# Patient Record
Sex: Male | Born: 1990 | Race: Black or African American | Hispanic: No | Marital: Single | Smoking: Never smoker
Health system: Southern US, Community
[De-identification: ages and names within clinical notes are randomized; demographics above are authoritative.]

## PROBLEM LIST (undated history)

## (undated) ENCOUNTER — Emergency Department (HOSPITAL_COMMUNITY): Admission: EM | Payer: BLUE CROSS/BLUE SHIELD

## (undated) DIAGNOSIS — M5431 Sciatica, right side: Secondary | ICD-10-CM

## (undated) HISTORY — DX: Sciatica, right side: M54.31

## (undated) HISTORY — PX: WISDOM TOOTH EXTRACTION: SHX21

---

## 2015-09-14 HISTORY — PX: HAND SURGERY: SHX662

## 2016-01-08 DIAGNOSIS — F902 Attention-deficit hyperactivity disorder, combined type: Secondary | ICD-10-CM | POA: Diagnosis not present

## 2016-04-01 DIAGNOSIS — F902 Attention-deficit hyperactivity disorder, combined type: Secondary | ICD-10-CM | POA: Diagnosis not present

## 2016-07-15 ENCOUNTER — Emergency Department (HOSPITAL_COMMUNITY)
Admission: EM | Admit: 2016-07-15 | Discharge: 2016-07-15 | Disposition: A | Discharge status: Home / Self Care | Payer: BLUE CROSS/BLUE SHIELD | Attending: Emergency Medicine | Admitting: Emergency Medicine

## 2016-07-15 ENCOUNTER — Encounter (HOSPITAL_COMMUNITY): Payer: Self-pay | Admitting: Emergency Medicine

## 2016-07-15 ENCOUNTER — Emergency Department (HOSPITAL_COMMUNITY): Payer: BLUE CROSS/BLUE SHIELD

## 2016-07-15 DIAGNOSIS — Y92009 Unspecified place in unspecified non-institutional (private) residence as the place of occurrence of the external cause: Secondary | ICD-10-CM | POA: Diagnosis not present

## 2016-07-15 DIAGNOSIS — Y939 Activity, unspecified: Secondary | ICD-10-CM | POA: Diagnosis not present

## 2016-07-15 DIAGNOSIS — S62346A Nondisplaced fracture of base of fifth metacarpal bone, right hand, initial encounter for closed fracture: Secondary | ICD-10-CM | POA: Insufficient documentation

## 2016-07-15 DIAGNOSIS — W2201XA Walked into wall, initial encounter: Secondary | ICD-10-CM | POA: Insufficient documentation

## 2016-07-15 DIAGNOSIS — F17200 Nicotine dependence, unspecified, uncomplicated: Secondary | ICD-10-CM | POA: Insufficient documentation

## 2016-07-15 DIAGNOSIS — S62366A Nondisplaced fracture of neck of fifth metacarpal bone, right hand, initial encounter for closed fracture: Secondary | ICD-10-CM | POA: Diagnosis not present

## 2016-07-15 DIAGNOSIS — S62396A Other fracture of fifth metacarpal bone, right hand, initial encounter for closed fracture: Secondary | ICD-10-CM | POA: Diagnosis not present

## 2016-07-15 DIAGNOSIS — S62306A Unspecified fracture of fifth metacarpal bone, right hand, initial encounter for closed fracture: Secondary | ICD-10-CM

## 2016-07-15 DIAGNOSIS — Y999 Unspecified external cause status: Secondary | ICD-10-CM | POA: Insufficient documentation

## 2016-07-15 DIAGNOSIS — S6991XA Unspecified injury of right wrist, hand and finger(s), initial encounter: Secondary | ICD-10-CM | POA: Diagnosis not present

## 2016-07-15 MED ORDER — HYDROCODONE-ACETAMINOPHEN 5-325 MG PO TABS: ORAL_TABLET | ORAL | 0 refills | Status: DC
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
## 2016-07-15 NOTE — Discharge Instructions (Addendum)
Take vicodin for breakthrough pain, do not drink alcohol, drive, care for children or do other critical tasks while taking vicodin. ° °Please follow with your primary care doctor in the next 2 days for a check-up. They must obtain records for further management.  ° °Do not hesitate to return to the Emergency Department for any new, worsening or concerning symptoms.  ° °
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
## 2016-07-21 DIAGNOSIS — M79641 Pain in right hand: Secondary | ICD-10-CM | POA: Diagnosis not present

## 2016-07-22 DIAGNOSIS — Y999 Unspecified external cause status: Secondary | ICD-10-CM | POA: Diagnosis not present

## 2016-07-22 DIAGNOSIS — S62316A Displaced fracture of base of fifth metacarpal bone, right hand, initial encounter for closed fracture: Secondary | ICD-10-CM | POA: Diagnosis not present

## 2016-07-22 DIAGNOSIS — S62301A Unspecified fracture of second metacarpal bone, left hand, initial encounter for closed fracture: Secondary | ICD-10-CM | POA: Diagnosis not present

## 2016-07-28 DIAGNOSIS — S62301D Unspecified fracture of second metacarpal bone, left hand, subsequent encounter for fracture with routine healing: Secondary | ICD-10-CM | POA: Diagnosis not present

## 2016-08-09 DIAGNOSIS — F902 Attention-deficit hyperactivity disorder, combined type: Secondary | ICD-10-CM | POA: Diagnosis not present

## 2016-08-11 DIAGNOSIS — S62301D Unspecified fracture of second metacarpal bone, left hand, subsequent encounter for fracture with routine healing: Secondary | ICD-10-CM | POA: Diagnosis not present

## 2016-08-25 DIAGNOSIS — S62301D Unspecified fracture of second metacarpal bone, left hand, subsequent encounter for fracture with routine healing: Secondary | ICD-10-CM | POA: Diagnosis not present

## 2016-09-22 DIAGNOSIS — M79641 Pain in right hand: Secondary | ICD-10-CM | POA: Diagnosis not present

## 2016-11-29 DIAGNOSIS — F902 Attention-deficit hyperactivity disorder, combined type: Secondary | ICD-10-CM | POA: Diagnosis not present

## 2017-01-11 DIAGNOSIS — R369 Urethral discharge, unspecified: Secondary | ICD-10-CM | POA: Diagnosis not present

## 2017-01-11 DIAGNOSIS — N342 Other urethritis: Secondary | ICD-10-CM | POA: Diagnosis not present

## 2017-01-11 DIAGNOSIS — N390 Urinary tract infection, site not specified: Secondary | ICD-10-CM | POA: Diagnosis not present

## 2017-01-11 DIAGNOSIS — Z0000 Encounter for general adult medical examination without abnormal findings: Secondary | ICD-10-CM | POA: Diagnosis not present

## 2017-01-11 DIAGNOSIS — Z114 Encounter for screening for human immunodeficiency virus [HIV]: Secondary | ICD-10-CM | POA: Diagnosis not present

## 2017-01-23 ENCOUNTER — Emergency Department (HOSPITAL_COMMUNITY)
Admission: EM | Admit: 2017-01-23 | Discharge: 2017-01-23 | Disposition: A | Discharge status: Home / Self Care | Payer: BLUE CROSS/BLUE SHIELD | Source: Home / Self Care | Attending: Emergency Medicine | Admitting: Emergency Medicine

## 2017-01-23 ENCOUNTER — Encounter (HOSPITAL_COMMUNITY): Payer: Self-pay

## 2017-01-23 DIAGNOSIS — Y92410 Unspecified street and highway as the place of occurrence of the external cause: Secondary | ICD-10-CM | POA: Insufficient documentation

## 2017-01-23 DIAGNOSIS — Z23 Encounter for immunization: Secondary | ICD-10-CM | POA: Insufficient documentation

## 2017-01-23 DIAGNOSIS — Z87891 Personal history of nicotine dependence: Secondary | ICD-10-CM | POA: Diagnosis not present

## 2017-01-23 DIAGNOSIS — R591 Generalized enlarged lymph nodes: Secondary | ICD-10-CM | POA: Diagnosis not present

## 2017-01-23 DIAGNOSIS — Y939 Activity, unspecified: Secondary | ICD-10-CM

## 2017-01-23 DIAGNOSIS — Z79899 Other long term (current) drug therapy: Secondary | ICD-10-CM | POA: Insufficient documentation

## 2017-01-23 DIAGNOSIS — S3992XA Unspecified injury of lower back, initial encounter: Secondary | ICD-10-CM | POA: Diagnosis not present

## 2017-01-23 DIAGNOSIS — S60221A Contusion of right hand, initial encounter: Secondary | ICD-10-CM | POA: Diagnosis not present

## 2017-01-23 DIAGNOSIS — R599 Enlarged lymph nodes, unspecified: Secondary | ICD-10-CM | POA: Diagnosis not present

## 2017-01-23 DIAGNOSIS — S39012A Strain of muscle, fascia and tendon of lower back, initial encounter: Secondary | ICD-10-CM | POA: Diagnosis not present

## 2017-01-23 DIAGNOSIS — K029 Dental caries, unspecified: Secondary | ICD-10-CM | POA: Diagnosis not present

## 2017-01-23 DIAGNOSIS — T148XXA Other injury of unspecified body region, initial encounter: Secondary | ICD-10-CM

## 2017-01-23 DIAGNOSIS — Y999 Unspecified external cause status: Secondary | ICD-10-CM | POA: Insufficient documentation

## 2017-01-23 DIAGNOSIS — S50811A Abrasion of right forearm, initial encounter: Secondary | ICD-10-CM | POA: Diagnosis not present

## 2017-01-23 DIAGNOSIS — M79641 Pain in right hand: Secondary | ICD-10-CM | POA: Insufficient documentation

## 2017-01-23 DIAGNOSIS — T07XXXA Unspecified multiple injuries, initial encounter: Secondary | ICD-10-CM
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
## 2017-01-23 NOTE — ED Notes (Signed)
Pt reported at time of DC he was angry he did not get pain meds.

## 2017-01-24 ENCOUNTER — Emergency Department (HOSPITAL_COMMUNITY): Payer: BLUE CROSS/BLUE SHIELD

## 2017-01-24 ENCOUNTER — Encounter (HOSPITAL_COMMUNITY): Payer: Self-pay | Admitting: Emergency Medicine

## 2017-01-24 ENCOUNTER — Emergency Department (HOSPITAL_COMMUNITY)
Admission: EM | Admit: 2017-01-24 | Discharge: 2017-01-24 | Disposition: A | Discharge status: Home / Self Care | Payer: BLUE CROSS/BLUE SHIELD | Attending: Emergency Medicine | Admitting: Emergency Medicine

## 2017-01-24 DIAGNOSIS — S39012A Strain of muscle, fascia and tendon of lower back, initial encounter: Secondary | ICD-10-CM

## 2017-01-24 DIAGNOSIS — M79641 Pain in right hand: Secondary | ICD-10-CM

## 2017-01-24 DIAGNOSIS — K029 Dental caries, unspecified: Secondary | ICD-10-CM

## 2017-01-24 DIAGNOSIS — R591 Generalized enlarged lymph nodes: Secondary | ICD-10-CM

## 2017-01-24 DIAGNOSIS — T148XXA Other injury of unspecified body region, initial encounter: Secondary | ICD-10-CM

## 2017-01-24 MED ORDER — TETANUS-DIPHTH-ACELL PERTUSSIS 5-2.5-18.5 LF-MCG/0.5 IM SUSP
0.5000 mL | Freq: Once | INTRAMUSCULAR | Status: AC
  Administered 2017-01-24: 0.5 mL via INTRAMUSCULAR
  Filled 2017-01-24: qty 0.5

## 2017-01-24 MED ORDER — DICLOFENAC SODIUM 1 % TD GEL: 2.0000 g | Freq: Four times a day (QID) | TRANSDERMAL | 0 refills | Status: DC

## 2017-01-24 MED ORDER — METHOCARBAMOL 500 MG PO TABS: 500.0000 mg | ORAL_TABLET | Freq: Two times a day (BID) | ORAL | 0 refills | Status: DC

## 2017-01-24 MED ORDER — METHOCARBAMOL 500 MG PO TABS
500.0000 mg | ORAL_TABLET | Freq: Once | ORAL | Status: AC
  Administered 2017-01-24: 500 mg via ORAL
  Filled 2017-01-24: qty 1

## 2017-01-24 MED ORDER — PENICILLIN V POTASSIUM 500 MG PO TABS: 1000.0000 mg | ORAL_TABLET | Freq: Two times a day (BID) | ORAL | 0 refills | Status: DC

## 2017-01-24 MED ORDER — NAPROXEN 500 MG PO TABS: 500.0000 mg | ORAL_TABLET | Freq: Two times a day (BID) | ORAL | 0 refills | Status: DC

## 2017-01-24 MED ORDER — KETOROLAC TROMETHAMINE 60 MG/2ML IM SOLN
60.0000 mg | Freq: Once | INTRAMUSCULAR | Status: AC
  Administered 2017-01-24: 60 mg via INTRAMUSCULAR
  Filled 2017-01-24: qty 2
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
## 2017-01-24 NOTE — Discharge Instructions (Signed)
1. Medications: robaxin, naproxyn, Voltaren gel, penicillin, usual home medications 2. Treatment: rest, drink plenty of fluids, gentle stretching as discussed, alternate ice and heat 3. Follow Up: Please followup with your primary doctor in 3 days for discussion of your diagnoses and further evaluation after today's visit; if you do not have a primary care doctor use the resource guide provided to find one;  Return to the ER for worsening back pain, difficulty walking, loss of bowel or bladder control or other concerning symptoms
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
## 2017-01-24 NOTE — ED Notes (Signed)
Pt in xray

## 2017-02-10 DIAGNOSIS — M549 Dorsalgia, unspecified: Secondary | ICD-10-CM | POA: Diagnosis not present

## 2017-02-15 ENCOUNTER — Ambulatory Visit (HOSPITAL_COMMUNITY)
Admission: RE | Admit: 2017-02-15 | Discharge: 2017-02-15 | Disposition: A | Discharge status: Home / Self Care | Payer: BLUE CROSS/BLUE SHIELD | Source: Ambulatory Visit | Attending: Internal Medicine | Admitting: Internal Medicine

## 2017-02-15 ENCOUNTER — Other Ambulatory Visit: Payer: Self-pay | Admitting: Internal Medicine

## 2017-02-15 DIAGNOSIS — M542 Cervicalgia: Secondary | ICD-10-CM | POA: Diagnosis not present

## 2017-02-15 DIAGNOSIS — M546 Pain in thoracic spine: Secondary | ICD-10-CM | POA: Diagnosis not present

## 2017-02-15 DIAGNOSIS — R52 Pain, unspecified: Secondary | ICD-10-CM | POA: Diagnosis not present

## 2017-02-15 DIAGNOSIS — M545 Low back pain: Secondary | ICD-10-CM | POA: Diagnosis not present

## 2017-02-15 DIAGNOSIS — S3992XA Unspecified injury of lower back, initial encounter: Secondary | ICD-10-CM | POA: Diagnosis not present

## 2017-02-15 DIAGNOSIS — S199XXA Unspecified injury of neck, initial encounter: Secondary | ICD-10-CM | POA: Diagnosis not present

## 2017-02-21 DIAGNOSIS — F902 Attention-deficit hyperactivity disorder, combined type: Secondary | ICD-10-CM | POA: Diagnosis not present

## 2017-02-21 DIAGNOSIS — S39012A Strain of muscle, fascia and tendon of lower back, initial encounter: Secondary | ICD-10-CM | POA: Diagnosis not present
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
## 2019-06-16 ENCOUNTER — Emergency Department (HOSPITAL_COMMUNITY)
Admission: EM | Admit: 2019-06-16 | Discharge: 2019-06-16 | Disposition: A | Discharge status: Home / Self Care | Payer: BLUE CROSS/BLUE SHIELD | Attending: Emergency Medicine | Admitting: Emergency Medicine

## 2019-06-16 ENCOUNTER — Other Ambulatory Visit: Payer: Self-pay

## 2019-06-16 ENCOUNTER — Encounter (HOSPITAL_COMMUNITY): Payer: Self-pay | Admitting: *Deleted

## 2019-06-16 DIAGNOSIS — Z5321 Procedure and treatment not carried out due to patient leaving prior to being seen by health care provider: Secondary | ICD-10-CM | POA: Insufficient documentation

## 2019-06-16 DIAGNOSIS — R1013 Epigastric pain: Secondary | ICD-10-CM | POA: Diagnosis present

## 2019-06-16 LAB — COMPREHENSIVE METABOLIC PANEL
ALT: 21 U/L (ref 0–44)
AST: 22 U/L (ref 15–41)
Albumin: 4.4 g/dL (ref 3.5–5.0)
Alkaline Phosphatase: 48 U/L (ref 38–126)
Anion gap: 10 (ref 5–15)
BUN: 17 mg/dL (ref 6–20)
CO2: 20 mmol/L — ABNORMAL LOW (ref 22–32)
Calcium: 9.3 mg/dL (ref 8.9–10.3)
Chloride: 108 mmol/L (ref 98–111)
Creatinine, Ser: 1.3 mg/dL — ABNORMAL HIGH (ref 0.61–1.24)
GFR calc Af Amer: >60 mL/min (ref >60)
GFR calc non Af Amer: >60 mL/min (ref >60)
Glucose, Bld: 111 mg/dL — ABNORMAL HIGH (ref 70–99)
Potassium: 3.7 mmol/L (ref 3.5–5.1)
Sodium: 138 mmol/L (ref 135–145)
Total Bilirubin: 0.8 mg/dL (ref 0.3–1.2)
Total Protein: 7.7 g/dL (ref 6.5–8.1)

## 2019-06-16 LAB — CBC
HCT: 44.1 % (ref 39.0–52.0)
Hemoglobin: 14.2 g/dL (ref 13.0–17.0)
MCH: 27.5 pg (ref 26.0–34.0)
MCHC: 32.2 g/dL (ref 30.0–36.0)
MCV: 85.3 fL (ref 80.0–100.0)
Platelets: 235 10*3/uL (ref 150–400)
RBC: 5.17 MIL/uL (ref 4.22–5.81)
RDW: 14.1 % (ref 11.5–15.5)
WBC: 9.8 10*3/uL (ref 4.0–10.5)
nRBC: 0 % (ref 0.0–0.2)

## 2019-06-16 LAB — LIPASE, BLOOD: Lipase: 21 U/L (ref 11–51)

## 2019-06-16 MED ORDER — SODIUM CHLORIDE 0.9% FLUSH: 3.0000 mL | Freq: Once | INTRAVENOUS | Status: DC

## 2019-06-16 NOTE — ED Notes (Signed)
Called and pt was not present in lobby.

## 2019-06-16 NOTE — ED Triage Notes (Signed)
Pt ate food on Wed and started having abd pain with diarrhea yesterday.

## 2020-05-20 ENCOUNTER — Other Ambulatory Visit: Payer: Self-pay | Admitting: Otolaryngology

## 2020-05-20 DIAGNOSIS — S022XXD Fracture of nasal bones, subsequent encounter for fracture with routine healing: Secondary | ICD-10-CM | POA: Insufficient documentation

## 2020-05-21 ENCOUNTER — Other Ambulatory Visit: Payer: Self-pay

## 2020-05-21 ENCOUNTER — Encounter (HOSPITAL_BASED_OUTPATIENT_CLINIC_OR_DEPARTMENT_OTHER): Payer: Self-pay | Admitting: Otolaryngology

## 2020-05-21 ENCOUNTER — Other Ambulatory Visit (HOSPITAL_COMMUNITY)
Admission: RE | Admit: 2020-05-21 | Discharge: 2020-05-21 | Disposition: A | Discharge status: Home / Self Care | Payer: Self-pay | Source: Ambulatory Visit | Attending: Otolaryngology | Admitting: Otolaryngology

## 2020-05-21 DIAGNOSIS — Z01812 Encounter for preprocedural laboratory examination: Secondary | ICD-10-CM | POA: Insufficient documentation

## 2020-05-21 DIAGNOSIS — Z20822 Contact with and (suspected) exposure to covid-19: Secondary | ICD-10-CM | POA: Insufficient documentation

## 2020-05-21 LAB — SARS CORONAVIRUS 2 (TAT 6-24 HRS): SARS Coronavirus 2: NEGATIVE

## 2020-05-23 ENCOUNTER — Ambulatory Visit (HOSPITAL_BASED_OUTPATIENT_CLINIC_OR_DEPARTMENT_OTHER)
Admission: RE | Admit: 2020-05-23 | Discharge: 2020-05-23 | Disposition: A | Discharge status: Home / Self Care | Payer: Self-pay | Attending: Otolaryngology | Admitting: Otolaryngology

## 2020-05-23 ENCOUNTER — Other Ambulatory Visit: Payer: Self-pay

## 2020-05-23 ENCOUNTER — Encounter (HOSPITAL_BASED_OUTPATIENT_CLINIC_OR_DEPARTMENT_OTHER)
Admission: RE | Disposition: A | Discharge status: Home / Self Care | Payer: Self-pay | Source: Home / Self Care | Attending: Otolaryngology

## 2020-05-23 ENCOUNTER — Encounter (HOSPITAL_BASED_OUTPATIENT_CLINIC_OR_DEPARTMENT_OTHER): Payer: Self-pay | Admitting: Otolaryngology

## 2020-05-23 ENCOUNTER — Ambulatory Visit (HOSPITAL_BASED_OUTPATIENT_CLINIC_OR_DEPARTMENT_OTHER): Payer: Self-pay | Admitting: Certified Registered"

## 2020-05-23 DIAGNOSIS — W01198A Fall on same level from slipping, tripping and stumbling with subsequent striking against other object, initial encounter: Secondary | ICD-10-CM | POA: Insufficient documentation

## 2020-05-23 DIAGNOSIS — Z87891 Personal history of nicotine dependence: Secondary | ICD-10-CM | POA: Insufficient documentation

## 2020-05-23 DIAGNOSIS — S022XXA Fracture of nasal bones, initial encounter for closed fracture: Secondary | ICD-10-CM | POA: Insufficient documentation

## 2020-05-23 HISTORY — PX: CLOSED REDUCTION NASAL FRACTURE: SHX5365

## 2020-05-23 SURGERY — CLOSED REDUCTION, FRACTURE, NASAL BONE
Anesthesia: General | Site: Nose

## 2020-05-23 MED ORDER — FENTANYL CITRATE (PF) 100 MCG/2ML IJ SOLN
INTRAMUSCULAR | Status: DC | PRN
Start: 2020-05-23 — End: 2020-05-23
  Administered 2020-05-23: 50 ug via INTRAVENOUS
  Administered 2020-05-23: 25 ug via INTRAVENOUS

## 2020-05-23 MED ORDER — LIDOCAINE 2% (20 MG/ML) 5 ML SYRINGE
INTRAMUSCULAR | Status: DC | PRN
  Administered 2020-05-23: 60 mg via INTRAVENOUS

## 2020-05-23 MED ORDER — LACTATED RINGERS IV SOLN: INTRAVENOUS | Status: DC

## 2020-05-23 MED ORDER — DEXAMETHASONE SODIUM PHOSPHATE 10 MG/ML IJ SOLN
INTRAMUSCULAR | Status: AC
  Filled 2020-05-23: qty 1

## 2020-05-23 MED ORDER — DEXMEDETOMIDINE (PRECEDEX) IN NS 20 MCG/5ML (4 MCG/ML) IV SYRINGE
PREFILLED_SYRINGE | INTRAVENOUS | Status: DC | PRN
  Administered 2020-05-23: 4 ug via INTRAVENOUS
  Administered 2020-05-23: 8 ug via INTRAVENOUS
  Administered 2020-05-23: 4 ug via INTRAVENOUS

## 2020-05-23 MED ORDER — FENTANYL CITRATE (PF) 100 MCG/2ML IJ SOLN: 25.0000 ug | INTRAMUSCULAR | Status: DC | PRN

## 2020-05-23 MED ORDER — DEXAMETHASONE SODIUM PHOSPHATE 10 MG/ML IJ SOLN
INTRAMUSCULAR | Status: DC | PRN
  Administered 2020-05-23: 5 mg via INTRAVENOUS

## 2020-05-23 MED ORDER — MIDAZOLAM HCL 2 MG/2ML IJ SOLN
INTRAMUSCULAR | Status: AC
  Filled 2020-05-23: qty 2

## 2020-05-23 MED ORDER — MIDAZOLAM HCL 5 MG/5ML IJ SOLN
INTRAMUSCULAR | Status: DC | PRN
  Administered 2020-05-23: 2 mg via INTRAVENOUS

## 2020-05-23 MED ORDER — AMISULPRIDE (ANTIEMETIC) 5 MG/2ML IV SOLN: 10.0000 mg | Freq: Once | INTRAVENOUS | Status: DC | PRN

## 2020-05-23 MED ORDER — ONDANSETRON HCL 4 MG/2ML IJ SOLN
INTRAMUSCULAR | Status: DC | PRN
  Administered 2020-05-23: 4 mg via INTRAVENOUS

## 2020-05-23 MED ORDER — SUCCINYLCHOLINE CHLORIDE 200 MG/10ML IV SOSY
PREFILLED_SYRINGE | INTRAVENOUS | Status: AC
  Filled 2020-05-23: qty 10

## 2020-05-23 MED ORDER — OXYCODONE HCL 5 MG PO TABS: 5.0000 mg | ORAL_TABLET | Freq: Once | ORAL | Status: DC | PRN

## 2020-05-23 MED ORDER — LIDOCAINE-EPINEPHRINE 1 %-1:100000 IJ SOLN
INTRAMUSCULAR | Status: DC | PRN
  Administered 2020-05-23: 1 mL

## 2020-05-23 MED ORDER — ROCURONIUM BROMIDE 10 MG/ML (PF) SYRINGE
PREFILLED_SYRINGE | INTRAVENOUS | Status: AC
  Filled 2020-05-23: qty 10

## 2020-05-23 MED ORDER — ACETAMINOPHEN 325 MG PO TABS: 325.0000 mg | ORAL_TABLET | Freq: Once | ORAL | Status: DC | PRN

## 2020-05-23 MED ORDER — KETOROLAC TROMETHAMINE 30 MG/ML IJ SOLN
INTRAMUSCULAR | Status: DC | PRN
  Administered 2020-05-23: 30 mg via INTRAVENOUS

## 2020-05-23 MED ORDER — OXYMETAZOLINE HCL 0.05 % NA SOLN
NASAL | Status: DC | PRN
  Administered 2020-05-23: 1 via TOPICAL

## 2020-05-23 MED ORDER — PROPOFOL 10 MG/ML IV BOLUS
INTRAVENOUS | Status: DC | PRN
  Administered 2020-05-23: 200 mg via INTRAVENOUS

## 2020-05-23 MED ORDER — FENTANYL CITRATE (PF) 100 MCG/2ML IJ SOLN
INTRAMUSCULAR | Status: AC
  Filled 2020-05-23: qty 2

## 2020-05-23 MED ORDER — KETOROLAC TROMETHAMINE 30 MG/ML IJ SOLN
INTRAMUSCULAR | Status: AC
  Filled 2020-05-23: qty 1

## 2020-05-23 MED ORDER — ACETAMINOPHEN 10 MG/ML IV SOLN: 1000.0000 mg | Freq: Once | INTRAVENOUS | Status: DC | PRN

## 2020-05-23 MED ORDER — MEPERIDINE HCL 25 MG/ML IJ SOLN: 6.2500 mg | INTRAMUSCULAR | Status: DC | PRN

## 2020-05-23 MED ORDER — DEXMEDETOMIDINE (PRECEDEX) IN NS 20 MCG/5ML (4 MCG/ML) IV SYRINGE
PREFILLED_SYRINGE | INTRAVENOUS | Status: AC
  Filled 2020-05-23: qty 5

## 2020-05-23 MED ORDER — OXYCODONE HCL 5 MG/5ML PO SOLN: 5.0000 mg | Freq: Once | ORAL | Status: DC | PRN

## 2020-05-23 MED ORDER — ONDANSETRON HCL 4 MG/2ML IJ SOLN
INTRAMUSCULAR | Status: AC
  Filled 2020-05-23: qty 2

## 2020-05-23 MED ORDER — ACETAMINOPHEN 160 MG/5ML PO SOLN: 325.0000 mg | Freq: Once | ORAL | Status: DC | PRN

## 2020-05-23 MED ORDER — LIDOCAINE 2% (20 MG/ML) 5 ML SYRINGE
INTRAMUSCULAR | Status: AC
  Filled 2020-05-23: qty 5

## 2020-05-23 MED ORDER — PROPOFOL 10 MG/ML IV BOLUS
INTRAVENOUS | Status: AC
  Filled 2020-05-23: qty 20

## 2020-05-23 SURGICAL SUPPLY — 32 items
APL SKNCLS STERI-STRIP NONHPOA (GAUZE/BANDAGES/DRESSINGS) ×1
BENZOIN TINCTURE PRP APPL 2/3 (GAUZE/BANDAGES/DRESSINGS) ×2 IMPLANT
CANISTER SUCT 1200ML W/VALVE (MISCELLANEOUS) IMPLANT
CNTNR URN SCR LID CUP LEK RST (MISCELLANEOUS) IMPLANT
CONT SPEC 4OZ STRL OR WHT (MISCELLANEOUS)
COVER BACK TABLE 60X90IN (DRAPES) ×2 IMPLANT
DECANTER SPIKE VIAL GLASS SM (MISCELLANEOUS) IMPLANT
DRSG CURAD 3X16 NADH (PACKING) IMPLANT
DRSG NASAL KENNEDY LMNT 8CM (GAUZE/BANDAGES/DRESSINGS) IMPLANT
DRSG NASOPORE 8CM (GAUZE/BANDAGES/DRESSINGS) IMPLANT
DRSG TELFA 3X8 NADH (GAUZE/BANDAGES/DRESSINGS) IMPLANT
GAUZE PACKING IODOFORM 1/2 (PACKING) IMPLANT
GAUZE SPONGE 4X4 12PLY STRL LF (GAUZE/BANDAGES/DRESSINGS) IMPLANT
GLOVE ECLIPSE 6.5 STRL STRAW (GLOVE) ×2 IMPLANT
GLOVE ECLIPSE 7.5 STRL STRAW (GLOVE) ×2 IMPLANT
GOWN STRL REUS W/ TWL LRG LVL3 (GOWN DISPOSABLE) ×1 IMPLANT
GOWN STRL REUS W/TWL LRG LVL3 (GOWN DISPOSABLE) ×2
MARKER SKIN DUAL TIP RULER LAB (MISCELLANEOUS) IMPLANT
NEEDLE PRECISIONGLIDE 27X1.5 (NEEDLE) ×2 IMPLANT
PACK BASIN DAY SURGERY FS (CUSTOM PROCEDURE TRAY) ×2 IMPLANT
PATTIES SURGICAL .5 X3 (DISPOSABLE) ×2 IMPLANT
SHEET MEDIUM DRAPE 40X70 STRL (DRAPES) ×2 IMPLANT
SHEET SILICONE 2X3 0.03 REINF (MISCELLANEOUS) IMPLANT
SPLINT NASAL THERMO PLAST (MISCELLANEOUS) ×2 IMPLANT
SPONGE GAUZE 2X2 8PLY STRL LF (GAUZE/BANDAGES/DRESSINGS) IMPLANT
SPONGE SURGIFOAM ABS GEL 12-7 (HEMOSTASIS) IMPLANT
STRIP CLOSURE SKIN 1/4X4 (GAUZE/BANDAGES/DRESSINGS) ×2 IMPLANT
SUT ETHILON 3 0 PS 1 (SUTURE) IMPLANT
SYR CONTROL 10ML LL (SYRINGE) ×2 IMPLANT
TOWEL GREEN STERILE FF (TOWEL DISPOSABLE) ×2 IMPLANT
TUBE CONNECTING 20X1/4 (TUBING) ×2 IMPLANT
YANKAUER SUCT BULB TIP NO VENT (SUCTIONS) IMPLANT

## 2020-05-23 NOTE — H&P (Signed)
HPI:   Peter Park is a 29 y.o. male who presents as a new patient with a chief complaint of nasal fracture. About 8 days ago, he tripped up some stairs and he hit his nose on the cement stairs. His nose bled badly and swelled. He feels that the nose looks normal but remains tender. He is breathing well through the nose.  PMH/Meds/All/SocHx/FamHx/ROS:   Past Medical History:  Diagnosis Date  . Allergy  . Asthma   Past Surgical History:  Procedure Laterality Date  . APPENDECTOMY  . HERNIA REPAIR  . right hand surgery  . TONSILLECTOMY  . TYMPANOSTOMY TUBE PLACEMENT   No family history of bleeding disorders, wound healing problems or difficulty with anesthesia.   Social History   Socioeconomic History  . Marital status: Single  Spouse name: Not on file  . Number of children: Not on file  . Years of education: Not on file  . Highest education level: Not on file  Occupational History  . Not on file  Tobacco Use  . Smoking status: Never Smoker  . Smokeless tobacco: Never Used  Substance and Sexual Activity  . Alcohol use: Never  . Drug use: Never  . Sexual activity: Not on file  Other Topics Concern  . Not on file  Social History Narrative  . Not on file   Social Determinants of Health   Financial Resource Strain:  . Difficulty of Paying Living Expenses:  Food Insecurity:  . Worried About Programme researcher, broadcasting/film/video in the Last Year:  . Barista in the Last Year:  Transportation Needs:  . Freight forwarder (Medical):  Marland Kitchen Lack of Transportation (Non-Medical):  Physical Activity:  . Days of Exercise per Week:  . Minutes of Exercise per Session:  Stress:  . Feeling of Stress :  Social Connections:  . Frequency of Communication with Friends and Family:  . Frequency of Social Gatherings with Friends and Family:  . Attends Religious Services:  . Active Member of Clubs or Organizations:  . Attends Banker Meetings:  Marland Kitchen Marital Status:   No  current outpatient medications on file.  A complete ROS was performed with pertinent positives/negatives noted in the HPI. The remainder of the ROS are negative.   Physical Exam:   Temp 97.3 F (36.3 C)  Ht 1.727 m (5\' 8" )  Wt 96.2 kg (212 lb)  BMI 32.23 kg/m   Appearance: alert, NAD, pleasant and cooperative Ability to communicate: normal voice Left ear: external ear normal, external auditory canal without substantial cerumen, tympanic membrane intact, middle ear aerated Right ear: external ear normal, external auditory canal without substantial cerumen, tympanic membrane intact, middle ear aerated Hearing: understands normal conversational speech Nose: external nose with mild leftward deviation, septum relatively midline, mild inferior turbinate hypertrophy; no lesions, masses, polyps or exudates  Lips, teeth, gums: no lesions, good dentition, healthy gums Oropharynx: mucosa without ulcers, masses or leukoplakia, normal tongue, tonsils 1+ Neck: no mass or tenderness, normal neck landmarks Thyroid: no thyromegaly or mass Lymphatics: no cervical adenopathy Salivary glands: soft, symmetric, no masses Facial strength: normal and symmetric CN II-XII intact  Independent Review of Additional Tests or Records:  None  Procedures:  None  Impression & Plans:  Peter Park is a 29 y.o. male with nasal fracture.  - His nose is mildly displaced to the left. We discussed options of no repair or closed nasal reduction. Risks, benefits, and alternatives were discussed and he expressed understanding and agreement.  Christia Reading, MD Otolaryngology

## 2020-05-23 NOTE — Anesthesia Procedure Notes (Signed)
Procedure Name: LMA Insertion Date/Time: 05/23/2020 10:28 AM Performed by: Marny Lowenstein, CRNA Pre-anesthesia Checklist: Patient identified, Emergency Drugs available, Suction available and Patient being monitored Patient Re-evaluated:Patient Re-evaluated prior to induction Oxygen Delivery Method: Circle system utilized Preoxygenation: Pre-oxygenation with 100% oxygen Induction Type: IV induction Ventilation: Mask ventilation without difficulty LMA: LMA inserted LMA Size: 4.0 Number of attempts: 1 Placement Confirmation: positive ETCO2 and breath sounds checked- equal and bilateral Tube secured with: Tape Dental Injury: Teeth and Oropharynx as per pre-operative assessment  Comments: Poor dentition present; multiple chipped/broken teeth. Teeth intact after LMA

## 2020-05-23 NOTE — Anesthesia Postprocedure Evaluation (Signed)
Anesthesia Post Note  Patient: Peter Park  Procedure(s) Performed: CLOSED REDUCTION NASAL FRACTURE (N/A Nose)     Patient location during evaluation: PACU Anesthesia Type: General Level of consciousness: awake and alert Pain management: pain level controlled Vital Signs Assessment: post-procedure vital signs reviewed and stable Respiratory status: spontaneous breathing, nonlabored ventilation, respiratory function stable and patient connected to nasal cannula oxygen Cardiovascular status: blood pressure returned to baseline and stable Postop Assessment: no apparent nausea or vomiting Anesthetic complications: no   No complications documented.  Last Vitals:  Vitals:   05/23/20 1103 05/23/20 1121  BP: 128/74 116/88  Pulse: 74 62  Resp: 16 16  Temp: 36.6 C 36.6 C  SpO2: 100% 100%    Last Pain:  Vitals:   05/23/20 1121  TempSrc:   PainSc: 0-No pain                 Shelton Silvas

## 2020-05-23 NOTE — Op Note (Signed)
OPERATIVE REPORT  DATE OF SURGERY: 05/23/2020  PATIENT:  Peter Park,  29 y.o. male  PRE-OPERATIVE DIAGNOSIS:  NASAL FRACTURE  POST-OPERATIVE DIAGNOSIS:  NASAL FRACTURE  PROCEDURE:  Procedure(s): CLOSED REDUCTION NASAL FRACTURE  SURGEON:  Susy Frizzle, MD  ASSISTANTS: None  ANESTHESIA:   General   EBL: Less than 20 ml  DRAINS: None  LOCAL MEDICATIONS USED: 1% Xylocaine with epinephrine  SPECIMEN:  none  COUNTS:  Correct  PROCEDURE DETAILS: The patient was taken to the operating room and placed on the operating table in the supine position. Following induction of general endotracheal anesthesia, using laryngeal mask airway, the nose was prepped and draped in a standard fashion.  1% Xylocaine with epinephrine was infiltrated into the right nasal cavity and the skin and mucosal side of the nasal bone and the upper nasal septum.  Afrin pledgets were then placed on the right side as well.  A butter knife nasal elevator was used on the right side to elevate the right nasal bone with simultaneous digital pressure on the left side and there was a nice pop as the bones were reduced back to a midline position.  The bones appear to be stable.  There was no bleeding.  The nasal dorsum was dressed with benzoin Steri-Strips and an Aquaplast splint.  Patient was awakened extubated and transferred to recovery in stable condition.    PATIENT DISPOSITION:  To PACU, stable

## 2020-05-23 NOTE — Anesthesia Preprocedure Evaluation (Addendum)
Anesthesia Evaluation  Patient identified by MRN, date of birth, ID band Patient awake    Reviewed: Allergy & Precautions, NPO status , Patient's Chart, lab work & pertinent test results  Airway Mallampati: I  TM Distance: >3 FB Neck ROM: Full    Dental  (+) Teeth Intact, Dental Advisory Given   Pulmonary former smoker,    breath sounds clear to auscultation       Cardiovascular negative cardio ROS   Rhythm:Regular Rate:Normal     Neuro/Psych negative neurological ROS     GI/Hepatic negative GI ROS, Neg liver ROS,   Endo/Other  negative endocrine ROS  Renal/GU negative Renal ROS     Musculoskeletal negative musculoskeletal ROS (+)   Abdominal Normal abdominal exam  (+)   Peds  Hematology negative hematology ROS (+)   Anesthesia Other Findings   Reproductive/Obstetrics                            Anesthesia Physical Anesthesia Plan  ASA: I  Anesthesia Plan: General   Post-op Pain Management:    Induction: Intravenous  PONV Risk Score and Plan: 3 and Ondansetron, Dexamethasone and Midazolam  Airway Management Planned: LMA  Additional Equipment: None  Intra-op Plan:   Post-operative Plan: Extubation in OR  Informed Consent: I have reviewed the patients History and Physical, chart, labs and discussed the procedure including the risks, benefits and alternatives for the proposed anesthesia with the patient or authorized representative who has indicated his/her understanding and acceptance.     Dental advisory given  Plan Discussed with: CRNA  Anesthesia Plan Comments:        Anesthesia Quick Evaluation

## 2020-05-23 NOTE — Discharge Instructions (Signed)
Keep the nose dry.  The splint will fall off in a few days.  Avoid any trauma to the nose the best you can.  Call us if you have any issues.  Okay to use Tylenol/Motrin as needed.    Post Anesthesia Home Care Instructions  Activity: Get plenty of rest for the remainder of the day. A responsible individual must stay with you for 24 hours following the procedure.  For the next 24 hours, DO NOT: -Drive a car -Advertising copywriter -Drink alcoholic beverages -Take any medication unless instructed by your physician -Make any legal decisions or sign important papers.  Meals: Start with liquid foods such as gelatin or soup. Progress to regular foods as tolerated. Avoid greasy, spicy, heavy foods. If nausea and/or vomiting occur, drink only clear liquids until the nausea and/or vomiting subsides. Call your physician if vomiting continues.  Special Instructions/Symptoms: Your throat may feel dry or sore from the anesthesia or the breathing tube placed in your throat during surgery. If this causes discomfort, gargle with warm salt water. The discomfort should disappear within 24 hours.  No Ibuprofen/Motrin until 4:30pm if needed.

## 2020-05-23 NOTE — Transfer of Care (Signed)
Immediate Anesthesia Transfer of Care Note  Patient: Peter Park  Procedure(s) Performed: CLOSED REDUCTION NASAL FRACTURE (N/A Nose)  Patient Location: PACU  Anesthesia Type:General  Level of Consciousness: awake, alert  and oriented  Airway & Oxygen Therapy: Patient Spontanous Breathing and Patient connected to face mask oxygen  Post-op Assessment: Report given to RN and Post -op Vital signs reviewed and stable  Post vital signs: Reviewed and stable  Last Vitals:  Vitals Value Taken Time  BP 102/74 05/23/20 1045  Temp    Pulse 63 05/23/20 1053  Resp 15 05/23/20 1053  SpO2 98 % 05/23/20 1053  Vitals shown include unvalidated device data.  Last Pain:  Vitals:   05/23/20 0918  TempSrc: Oral  PainSc: 0-No pain      Patients Stated Pain Goal: 5 (05/23/20 9794)  Complications: No complications documented.

## 2020-05-26 ENCOUNTER — Encounter (HOSPITAL_BASED_OUTPATIENT_CLINIC_OR_DEPARTMENT_OTHER): Payer: Self-pay | Admitting: Otolaryngology

## 2020-11-11 ENCOUNTER — Ambulatory Visit: Payer: Self-pay

## 2020-11-11 ENCOUNTER — Other Ambulatory Visit: Payer: Self-pay | Admitting: Occupational Medicine

## 2020-11-11 ENCOUNTER — Other Ambulatory Visit: Payer: Self-pay

## 2020-11-11 DIAGNOSIS — Z0000 Encounter for general adult medical examination without abnormal findings: Secondary | ICD-10-CM

## 2021-03-12 ENCOUNTER — Ambulatory Visit: Payer: Self-pay

## 2021-03-12 ENCOUNTER — Other Ambulatory Visit: Payer: Self-pay | Admitting: Family Medicine

## 2021-03-12 ENCOUNTER — Other Ambulatory Visit: Payer: Self-pay

## 2021-03-12 DIAGNOSIS — Z0000 Encounter for general adult medical examination without abnormal findings: Secondary | ICD-10-CM

## 2021-08-11 ENCOUNTER — Emergency Department (HOSPITAL_COMMUNITY)
Admission: EM | Admit: 2021-08-11 | Discharge: 2021-08-12 | Disposition: A | Discharge status: Home / Self Care | Payer: BC Managed Care – PPO | Attending: Emergency Medicine | Admitting: Emergency Medicine

## 2021-08-11 DIAGNOSIS — R197 Diarrhea, unspecified: Secondary | ICD-10-CM | POA: Insufficient documentation

## 2021-08-11 DIAGNOSIS — R6883 Chills (without fever): Secondary | ICD-10-CM | POA: Diagnosis not present

## 2021-08-11 DIAGNOSIS — Z20822 Contact with and (suspected) exposure to covid-19: Secondary | ICD-10-CM | POA: Insufficient documentation

## 2021-08-11 DIAGNOSIS — R1110 Vomiting, unspecified: Secondary | ICD-10-CM | POA: Diagnosis not present

## 2021-08-11 DIAGNOSIS — Z5321 Procedure and treatment not carried out due to patient leaving prior to being seen by health care provider: Secondary | ICD-10-CM | POA: Insufficient documentation

## 2021-08-11 DIAGNOSIS — R7309 Other abnormal glucose: Secondary | ICD-10-CM | POA: Insufficient documentation

## 2021-08-11 DIAGNOSIS — R059 Cough, unspecified: Secondary | ICD-10-CM | POA: Insufficient documentation

## 2021-08-12 ENCOUNTER — Encounter (HOSPITAL_COMMUNITY): Payer: Self-pay | Admitting: Emergency Medicine

## 2021-08-12 ENCOUNTER — Other Ambulatory Visit: Payer: Self-pay

## 2021-08-12 LAB — RESP PANEL BY RT-PCR (FLU A&B, COVID) ARPGX2
Influenza A by PCR: NEGATIVE
Influenza B by PCR: NEGATIVE
SARS Coronavirus 2 by RT PCR: NEGATIVE

## 2021-08-12 LAB — CBG MONITORING, ED: Glucose-Capillary: 102 mg/dL — ABNORMAL HIGH (ref 70–99)

## 2021-08-12 MED ORDER — IBUPROFEN 400 MG PO TABS
600.0000 mg | ORAL_TABLET | Freq: Once | ORAL | Status: AC
  Administered 2021-08-12: 600 mg via ORAL
  Filled 2021-08-12: qty 1

## 2021-08-12 MED ORDER — ONDANSETRON 4 MG PO TBDP
4.0000 mg | ORAL_TABLET | Freq: Once | ORAL | Status: AC
  Administered 2021-08-12: 4 mg via ORAL
  Filled 2021-08-12: qty 1

## 2021-08-12 NOTE — ED Triage Notes (Signed)
Pt C/O chills, cough and vomiting/loose stool.

## 2021-08-12 NOTE — ED Provider Notes (Signed)
Emergency Medicine Provider Triage Evaluation Note  Peter Park , a 30 y.o. male  was evaluated in triage.  Pt complains of feeling unwell. Symptoms present x 4 days. C/o chills with nausea, vomiting, diarrhea, lightheadedness, cough. Has been using Mucinex w/o relief. Was with family on Friday for the holiday and wasn't made aware that "everyone was sick".  Review of Systems  Positive: As above Negative: Abdominal pain, chest pain, syncope, melena, hematochezia, hematemesis.  Physical Exam  BP (!) 140/100 (BP Location: Right Arm)   Pulse 76   Temp 97.7 F (36.5 C) (Oral)   Resp 20   SpO2 100%  Gen:   Awake, no distress   Resp:  Normal effort  MSK:   Moves extremities without difficulty  Other:  Soft, obese, nontender abdomen. Heart RRR.  Medical Decision Making  Medically screening exam initiated at 12:10 AM.  Appropriate orders placed.  Peter Park was informed that the remainder of the evaluation will be completed by another provider, this initial triage assessment does not replace that evaluation, and the importance of remaining in the ED until their evaluation is complete.  Flu-like symptoms   Peter Park, Peter Park 08/12/21 0012    Peter Fossa, MD 08/12/21 [phone]

## 2021-08-12 NOTE — ED Notes (Signed)
PT decided to leave while waiting for a room.

## 2022-02-02 DIAGNOSIS — S20212A Contusion of left front wall of thorax, initial encounter: Secondary | ICD-10-CM | POA: Diagnosis not present

## 2022-06-01 DIAGNOSIS — M5431 Sciatica, right side: Secondary | ICD-10-CM | POA: Diagnosis not present

## 2022-06-01 DIAGNOSIS — K625 Hemorrhage of anus and rectum: Secondary | ICD-10-CM | POA: Diagnosis not present

## 2022-06-01 DIAGNOSIS — M79644 Pain in right finger(s): Secondary | ICD-10-CM | POA: Diagnosis not present

## 2022-06-17 ENCOUNTER — Encounter: Payer: Self-pay | Admitting: Adult Health

## 2022-06-17 ENCOUNTER — Ambulatory Visit: Payer: BC Managed Care – PPO | Admitting: Adult Health

## 2022-06-17 VITALS — BP 142/100 | HR 72 | Temp 97.1°F | Ht 67.0 in | Wt 234.6 lb

## 2022-06-17 DIAGNOSIS — Z125 Encounter for screening for malignant neoplasm of prostate: Secondary | ICD-10-CM | POA: Diagnosis not present

## 2022-06-17 DIAGNOSIS — Z7689 Persons encountering health services in other specified circumstances: Secondary | ICD-10-CM | POA: Diagnosis not present

## 2022-06-17 DIAGNOSIS — G629 Polyneuropathy, unspecified: Secondary | ICD-10-CM

## 2022-06-17 DIAGNOSIS — Z113 Encounter for screening for infections with a predominantly sexual mode of transmission: Secondary | ICD-10-CM | POA: Diagnosis not present

## 2022-06-17 DIAGNOSIS — Z800 Family history of malignant neoplasm of digestive organs: Secondary | ICD-10-CM

## 2022-06-17 DIAGNOSIS — I10 Essential (primary) hypertension: Secondary | ICD-10-CM | POA: Diagnosis not present

## 2022-06-17 DIAGNOSIS — Z8042 Family history of malignant neoplasm of prostate: Secondary | ICD-10-CM

## 2022-06-17 DIAGNOSIS — Z833 Family history of diabetes mellitus: Secondary | ICD-10-CM | POA: Diagnosis not present

## 2022-06-17 NOTE — Patient Instructions (Signed)
Preventive Care 21-32 Years Old, Male Preventive care refers to lifestyle choices and visits with your health care provider that can promote health and wellness. Preventive care visits are also called wellness exams. What can I expect for my preventive care visit? Counseling During your preventive care visit, your health care provider may ask about your: Medical history, including: Past medical problems. Family medical history. Current health, including: Emotional well-being. Home life and relationship well-being. Sexual activity. Lifestyle, including: Alcohol, nicotine or tobacco, and drug use. Access to firearms. Diet, exercise, and sleep habits. Safety issues such as seatbelt and bike helmet use. Sunscreen use. Work and work environment. Physical exam Your health care provider may check your: Height and weight. These may be used to calculate your BMI (body mass index). BMI is a measurement that tells if you are at a healthy weight. Waist circumference. This measures the distance around your waistline. This measurement also tells if you are at a healthy weight and may help predict your risk of certain diseases, such as type 2 diabetes and high blood pressure. Heart rate and blood pressure. Body temperature. Skin for abnormal spots. What immunizations do I need?  Vaccines are usually given at various ages, according to a schedule. Your health care provider will recommend vaccines for you based on your age, medical history, and lifestyle or other factors, such as travel or where you work. What tests do I need? Screening Your health care provider may recommend screening tests for certain conditions. This may include: Lipid and cholesterol levels. Diabetes screening. This is done by checking your blood sugar (glucose) after you have not eaten for a while (fasting). Hepatitis B test. Hepatitis C test. HIV (human immunodeficiency virus) test. STI (sexually transmitted infection)  testing, if you are at risk. Talk with your health care provider about your test results, treatment options, and if necessary, the need for more tests. Follow these instructions at home: Eating and drinking  Eat a healthy diet that includes fresh fruits and vegetables, whole grains, lean protein, and low-fat dairy products. Drink enough fluid to keep your urine pale yellow. Take vitamin and mineral supplements as recommended by your health care provider. Do not drink alcohol if your health care provider tells you not to drink. If you drink alcohol: Limit how much you have to 0-2 drinks a day. Know how much alcohol is in your drink. In the [place] one drink equals one 12 oz bottle of beer (355 mL), one 5 oz glass of wine (148 mL), or one 1 oz glass of hard liquor (44 mL). Lifestyle Brush your teeth every morning and night with fluoride toothpaste. Floss one time each day. Exercise for at least 30 minutes 5 or more days each week. Do not use any products that contain nicotine or tobacco. These products include cigarettes, chewing tobacco, and vaping devices, such as e-cigarettes. If you need help quitting, ask your health care provider. Do not use drugs. If you are sexually active, practice safe sex. Use a condom or other form of protection to prevent STIs. Find healthy ways to manage stress, such as: Meditation, yoga, or listening to music. Journaling. Talking to a trusted person. Spending time with friends and family. Minimize exposure to UV radiation to reduce your risk of skin cancer. Safety Always wear your seat belt while driving or riding in a vehicle. Do not drive: If you have been drinking alcohol. Do not ride with someone who has been drinking. If you have been using any mind-altering substances   or drugs. While texting. When you are tired or distracted. Wear a helmet and other protective equipment during sports activities. If you have firearms in your house, make sure you  follow all gun safety procedures. Seek help if you have been physically or sexually abused. What's next? Go to your health care provider once a year for an annual wellness visit. Ask your health care provider how often you should have your eyes and teeth checked. Stay up to date on all vaccines. This information is not intended to replace advice given to you by your health care provider. Make sure you discuss any questions you have with your health care provider. Document Revised: 02/25/2021 Document Reviewed: 02/25/2021 Elsevier Patient Education  2023 Elsevier Inc.  

## 2022-06-17 NOTE — Progress Notes (Signed)
[facility] clinic  Provider:  Durenda Age DNP  Code Status:   Full Code  Goals of Care:     05/23/2020    9:09 AM  Advanced Directives  Does Patient Have a Medical Advance Directive? No  Would patient like information on creating a medical advance directive? No - Patient declined     Chief Complaint  Patient presents with   Establish Care    New patient to [facility]    HPI: Patient is a 31 y.o. male seen today to establish care with [facility]. He complained of having blood when he wipes his rectum every other day for 3 years. He denies abdominal pain. He used to work for [employer] for 2.5 years. He has leg numbness for a month now. 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   His mother has history of colon cancer and hypertension. His father has prostate cancer and hypertension. He has a sister who is healthy.    Past Medical History:  Diagnosis Date   Sciatica of right side     Past Surgical History:  Procedure Laterality Date   CLOSED REDUCTION NASAL FRACTURE N/A 05/23/2020   Procedure: CLOSED REDUCTION NASAL FRACTURE;  Surgeon: Izora Gala, MD;  Location: [facility];  Service: ENT;  Laterality: N/A;   HAND SURGERY Right 2017   WISDOM TOOTH EXTRACTION      No Known Allergies  No outpatient encounter medications on file as of 06/17/2022.   No facility-administered encounter medications on file as of 06/17/2022.    Review of Systems:  Review of Systems  Constitutional:  Negative for activity change, appetite change and fever.  HENT:  Negative for sore throat.   Eyes: Negative.   Cardiovascular:  Negative for chest pain and leg swelling.  Gastrointestinal:  Negative for abdominal distention, diarrhea and vomiting.  Genitourinary:  Negative for dysuria, frequency and urgency.       Blood in stool   Skin:  Negative for color change.  Neurological:  Positive for numbness. Negative for dizziness and headaches.       Right leg numbness    Psychiatric/Behavioral:  Negative for behavioral problems and sleep disturbance. The patient is not nervous/anxious.     Health Maintenance  Topic Date Due   COVID-19 Vaccine (1) Never done   INFLUENZA VACCINE  12/12/2022 (Originally 04/13/2022)   TETANUS/TDAP  01/25/2027   Hepatitis C Screening  Completed   HIV Screening  Completed   HPV VACCINES  Aged Out    Physical Exam: Vitals:   06/17/22 1359  BP: (!) 142/100  Pulse: 72  Temp: (!) 97.1 F (36.2 C)  SpO2: 97%  Weight: 234 lb 9.6 oz (106.4 kg)  Height: 5' 7" (1.702 m)   Body mass index is 36.74 kg/m. Physical Exam Constitutional:      General: He is not in acute distress.    Appearance: He is obese.  HENT:     Head: Normocephalic and atraumatic.     Mouth/Throat:     Mouth: Mucous membranes are moist.  Eyes:     Conjunctiva/sclera: Conjunctivae normal.  Cardiovascular:     Rate and Rhythm: Normal rate and regular rhythm.     Pulses: Normal pulses.     Heart sounds: Normal heart sounds.  Pulmonary:     Effort: Pulmonary effort is normal.     Breath sounds: Normal breath sounds.  Abdominal:     General: Bowel sounds are normal.     Palpations: Abdomen  is soft.  Musculoskeletal:        General: No swelling. Normal range of motion.     Cervical back: Normal range of motion.  Skin:    General: Skin is warm and dry.  Neurological:     General: No focal deficit present.     Mental Status: He is alert and oriented to person, place, and time.     Comments: Right leg numbness  Psychiatric:        Mood and Affect: Mood normal.        Behavior: Behavior normal.        Thought Content: Thought content normal.        Judgment: Judgment normal.     Labs reviewed: Basic Metabolic Panel: Recent Labs    06/17/22 1531  NA 139  K 4.8  CL 105  CO2 25  GLUCOSE 91  BUN 13  CREATININE 0.96  CALCIUM 10.1   Liver Function Tests: Recent Labs    06/17/22 1531  AST 14  ALT 20  BILITOT 0.4  PROT 7.8   No  results for input(s): "LIPASE", "AMYLASE" in the last 8760 hours. No results for input(s): "AMMONIA" in the last 8760 hours. CBC: Recent Labs    06/17/22 1531  WBC 5.9  NEUTROABS 3,233  HGB 14.5  HCT 44.0  MCV 82.4  PLT 274   Lipid Panel: Recent Labs    06/17/22 1541  CHOL 196  HDL 53  LDLCALC 121*  TRIG 108  CHOLHDL 3.7   Lab Results  Component Value Date   HGBA1C 5.8 (H) 06/17/2022    Procedures since last visit: No results found.  Assessment/Plan 1. Encounter to establish care - HIV antibody (with reflex) - Hep C Antibody  2. Family history of prostate cancer in father -  father has prostate cancer - PSA  3. Family history of colon cancer in mother -  has blood in stool X 3 years - CBC with Differential/Platelet - CMP with eGFR([facility]) - Ambulatory referral to Gastroenterology  4. Routine screening for STI (sexually transmitted infection) - HIV antibody (with reflex) - Hep C Antibody - HSV(herpes simplex vrs) 1+2 ab-IgG - Chlamydia/Neisseria Gonorrhoeae RNA,TMA,Urogenital  5. Neuropathy -  has leg numbness - Lipid Panel - Hemoglobin A1c - Vitamin B12    Labs/tests ordered:   Hemoglobin A1c - Vitamin B12, lipid panel HIV antibody (with reflex) - Hep C Antibody CBC with Differential/Platelet - CMP with eGFR([facility]) - PSA   Next appt:  in 3 months    

## 2022-06-18 LAB — COMPLETE METABOLIC PANEL WITH GFR
AG Ratio: 1.4 (calc) (ref 1.0–2.5)
ALT: 20 U/L (ref 9–46)
AST: 14 U/L (ref 10–40)
Albumin: 4.5 g/dL (ref 3.6–5.1)
Alkaline phosphatase (APISO): 58 U/L (ref 36–130)
BUN: 13 mg/dL (ref 7–25)
CO2: 25 mmol/L (ref 20–32)
Calcium: 10.1 mg/dL (ref 8.6–10.3)
Chloride: 105 mmol/L (ref 98–110)
Creat: 0.96 mg/dL (ref 0.60–1.26)
Globulin: 3.3 g/dL (calc) (ref 1.9–3.7)
Glucose, Bld: 91 mg/dL (ref 65–99)
Potassium: 4.8 mmol/L (ref 3.5–5.3)
Sodium: 139 mmol/L (ref 135–146)
Total Bilirubin: 0.4 mg/dL (ref 0.2–1.2)
Total Protein: 7.8 g/dL (ref 6.1–8.1)
eGFR: 108 mL/min/{1.73_m2} (ref >=60)

## 2022-06-18 LAB — LIPID PANEL
Cholesterol: 196 mg/dL (ref <200)
HDL: 53 mg/dL (ref >=40)
LDL Cholesterol (Calc): 121 mg/dL (calc) — ABNORMAL HIGH
Non-HDL Cholesterol (Calc): 143 mg/dL (calc) — ABNORMAL HIGH (ref <130)
Total CHOL/HDL Ratio: 3.7 (calc) (ref <5.0)
Triglycerides: 108 mg/dL (ref <150)

## 2022-06-18 LAB — CBC WITH DIFFERENTIAL/PLATELET
Absolute Monocytes: 490 cells/uL (ref 200–950)
Basophils Absolute: 30 cells/uL (ref 0–200)
Basophils Relative: 0.5 %
Eosinophils Absolute: 177 cells/uL (ref 15–500)
Eosinophils Relative: 3 %
HCT: 44 % (ref 38.5–50.0)
Hemoglobin: 14.5 g/dL (ref 13.2–17.1)
Lymphs Abs: 1971 cells/uL (ref 850–3900)
MCH: 27.2 pg (ref 27.0–33.0)
MCHC: 33 g/dL (ref 32.0–36.0)
MCV: 82.4 fL (ref 80.0–100.0)
MPV: 10.8 fL (ref 7.5–12.5)
Monocytes Relative: 8.3 %
Neutro Abs: 3233 cells/uL (ref 1500–7800)
Neutrophils Relative %: 54.8 %
Platelets: 274 10*3/uL (ref 140–400)
RBC: 5.34 10*6/uL (ref 4.20–5.80)
RDW: 14.1 % (ref 11.0–15.0)
Total Lymphocyte: 33.4 %
WBC: 5.9 10*3/uL (ref 3.8–10.8)

## 2022-06-18 LAB — HEPATITIS C ANTIBODY: Hepatitis C Ab: NONREACTIVE

## 2022-06-18 LAB — HEMOGLOBIN A1C
Hgb A1c MFr Bld: 5.8 % of total Hgb — ABNORMAL HIGH (ref <5.7)
Mean Plasma Glucose: 120 mg/dL
eAG (mmol/L): 6.6 mmol/L

## 2022-06-18 LAB — HIV ANTIBODY (ROUTINE TESTING W REFLEX): HIV 1&2 Ab, 4th Generation: NONREACTIVE

## 2022-06-18 LAB — PSA: PSA: 1.88 ng/mL (ref <=4.00)

## 2022-06-20 LAB — CHLAMYDIA/NEISSERIA GONORRHOEAE RNA,TMA,UROGENTIAL
C. trachomatis RNA, TMA: NOT DETECTED
N. gonorrhoeae RNA, TMA: NOT DETECTED

## 2022-06-20 LAB — HSV(HERPES SIMPLEX VRS) I + II AB-IGG
HAV 1 IGG,TYPE SPECIFIC AB: <0.9 index
HSV 2 IGG,TYPE SPECIFIC AB: <0.9 index

## 2022-06-21 ENCOUNTER — Ambulatory Visit: Payer: BC Managed Care – PPO | Admitting: Adult Health

## 2022-06-21 NOTE — Progress Notes (Signed)
-    A1c 5.8, considered prediabetic -    LDL 121, elevated (normal <100), needs to increase fruits and vegetables, cut down red meat, regular exercise -   PSA , CBC and CMP normal -   hep C, HIVB, herpes, gonorrhea, and C trachomatis were negative

## 2022-07-30 ENCOUNTER — Ambulatory Visit: Payer: BC Managed Care – PPO | Admitting: Adult Health

## 2022-09-18 DIAGNOSIS — R051 Acute cough: Secondary | ICD-10-CM | POA: Diagnosis not present

## 2022-09-18 DIAGNOSIS — J029 Acute pharyngitis, unspecified: Secondary | ICD-10-CM | POA: Diagnosis not present

## 2022-10-21 ENCOUNTER — Ambulatory Visit: Payer: BC Managed Care – PPO | Admitting: Adult Health

## 2022-10-21 ENCOUNTER — Encounter: Payer: Self-pay | Admitting: Adult Health

## 2022-10-21 VITALS — BP 118/80 | HR 97 | Temp 97.8°F | Resp 18 | Ht 68.0 in | Wt 229.5 lb

## 2022-10-21 DIAGNOSIS — J029 Acute pharyngitis, unspecified: Secondary | ICD-10-CM

## 2022-10-21 MED ORDER — LIDOCAINE VISCOUS HCL 2 % MT SOLN: 15.0000 mL | OROMUCOSAL | 3 refills | Status: AC | PRN

## 2022-10-21 MED ORDER — AZITHROMYCIN 250 MG PO TABS: ORAL_TABLET | ORAL | 0 refills | Status: AC

## 2022-10-21 NOTE — Progress Notes (Signed)
-  Location:  [place]   Place of Service:   [place]: full   No Known Allergies  Chief Complaint  Patient presents with   Acute Visit    Recurrent Pharyngitis    HPI:  About 4 weeks ago he had a sore throat making it difficult to cough or blow his nose. He was given steroids and lidocaine viscus at that time.  He has had one headache since that time. He has not had any fevers. Today he is having the same symptoms. He does work in a [employer], contracting. There appears to be poor ventilation present. We have discussed that he is probably allergic to something in the air. He will begin claritin daily. He feels as though he has an infection present.   Past Medical History:  Diagnosis Date   Sciatica of right side     Past Surgical History:  Procedure Laterality Date   CLOSED REDUCTION NASAL FRACTURE N/A 05/23/2020   Procedure: CLOSED REDUCTION NASAL FRACTURE;  Surgeon: Izora Gala, MD;  Location: [place];  Service: ENT;  Laterality: N/A;   HAND SURGERY Right 2017   WISDOM TOOTH EXTRACTION      Social History   Socioeconomic History   Marital status: Single    Spouse name: Not on file   Number of children: Not on file   Years of education: Not on file   Highest education level: Not on file  Occupational History   Not on file  Tobacco Use   Smoking status: Never   Smokeless tobacco: Never  Vaping Use   Vaping Use: Former  Substance and Sexual Activity   Alcohol use: Not Currently   Drug use: Yes    Types: Marijuana   Sexual activity: Not on file  Other Topics Concern   Not on file  Social History Narrative   Not on file   Social Determinants of Health   Financial Resource Strain: Not on file  Food Insecurity: Not on file  Transportation Needs: Not on file  Physical Activity: Not on file  Stress: Not on file  Social Connections: Not on file  Intimate Partner Violence: Not on file   Family History  Problem Relation Age of  Onset   Colon cancer Mother    Hypertension Mother    Hypertension Father    Prostate cancer Father    Bronchitis Father    Colon cancer Paternal Uncle       VITAL SIGNS BP 118/80   Pulse 97   Temp 97.8 F (36.6 C)   Resp 18   Ht 5\' 8"  (1.727 m)   Wt 229 lb 8 oz (104.1 kg)   SpO2 99%   BMI 34.90 kg/m   No outpatient encounter medications on file as of 10/21/2022.   No facility-administered encounter medications on file as of 10/21/2022.     SIGNIFICANT DIAGNOSTIC EXAMS  Review of Systems  Constitutional:  Negative for malaise/fatigue.  HENT:  Positive for congestion, nosebleeds and sore throat. Negative for sinus pain.   Respiratory:  Negative for cough and shortness of breath.        Has coughed one time   Cardiovascular:  Negative for chest pain, palpitations and leg swelling.  Gastrointestinal:  Negative for abdominal pain, constipation and heartburn.  Musculoskeletal:  Negative for back pain, joint pain and myalgias.  Skin: Negative.   Neurological:  Negative for dizziness.  Psychiatric/Behavioral:  The patient is not nervous/anxious.  Physical Exam Constitutional:      General: He is not in acute distress.    Appearance: He is well-developed. He is obese. He is not diaphoretic.  HENT:     Nose: Nose normal.     Mouth/Throat:     Mouth: Mucous membranes are moist.     Comments: There is redness present  Neck:     Thyroid: No thyromegaly.  Cardiovascular:     Rate and Rhythm: Normal rate and regular rhythm.     Heart sounds: Normal heart sounds.  Pulmonary:     Effort: Pulmonary effort is normal. No respiratory distress.     Breath sounds: Normal breath sounds.  Abdominal:     General: Bowel sounds are normal. There is no distension.     Palpations: Abdomen is soft.     Tenderness: There is no abdominal tenderness.  Musculoskeletal:        General: Normal range of motion.     Cervical back: Neck supple. No rigidity or tenderness.     Right lower  leg: No edema.     Left lower leg: No edema.  Lymphadenopathy:     Cervical: No cervical adenopathy.  Skin:    General: Skin is warm and dry.  Neurological:     Mental Status: He is alert and oriented to person, place, and time.  Psychiatric:        Mood and Affect: Mood normal.       ASSESSMENT/ PLAN:  TODAY  Pharyngitis unspecified etiology: will begin a z-pack and lidocaine viscus. He is to take 2 tablets today then 1 daily for 4 more days. He is to use 1 tablespoon or 2 tablespoons as needed for his sore throat. He will take claritin daily for the antihistamine effect. He has been told to return to clinic if he is not better in 10 days    Ok Edwards NP [facility]   call [phone]

## 2022-10-21 NOTE — Patient Instructions (Addendum)
For the Z-pack: take 2 tablets today and then one tablet daily until complete at the same time each day  For the lidocaine viscus use 1 tablespoon as needed for sore throat; may use 2 tablespoons full if needed

## 2022-11-12 DIAGNOSIS — R519 Headache, unspecified: Secondary | ICD-10-CM | POA: Diagnosis not present

## 2022-11-12 DIAGNOSIS — R051 Acute cough: Secondary | ICD-10-CM | POA: Diagnosis not present

## 2023-06-08 ENCOUNTER — Ambulatory Visit: Payer: BC Managed Care – PPO | Admitting: Adult Health

## 2023-06-17 ENCOUNTER — Ambulatory Visit: Payer: BC Managed Care – PPO | Admitting: Adult Health

## 2023-06-17 ENCOUNTER — Encounter: Payer: Self-pay | Admitting: Adult Health

## 2023-06-17 ENCOUNTER — Telehealth: Payer: Self-pay | Admitting: Adult Health

## 2023-06-17 VITALS — BP 136/88 | HR 75 | Temp 97.7°F | Resp 17 | Ht 68.0 in | Wt 241.0 lb

## 2023-06-17 DIAGNOSIS — E7800 Pure hypercholesterolemia, unspecified: Secondary | ICD-10-CM | POA: Diagnosis not present

## 2023-06-17 DIAGNOSIS — R7303 Prediabetes: Secondary | ICD-10-CM | POA: Diagnosis not present

## 2023-06-17 DIAGNOSIS — H538 Other visual disturbances: Secondary | ICD-10-CM

## 2023-06-17 DIAGNOSIS — K921 Melena: Secondary | ICD-10-CM

## 2023-06-17 DIAGNOSIS — Z113 Encounter for screening for infections with a predominantly sexual mode of transmission: Secondary | ICD-10-CM

## 2023-06-17 DIAGNOSIS — M79641 Pain in right hand: Secondary | ICD-10-CM

## 2023-06-17 NOTE — Telephone Encounter (Signed)
Faxed documentation for FMLA to the [employer] [phone]). Monina gave patient a copy, and I sent the original to the [facility].

## 2023-06-17 NOTE — Progress Notes (Unsigned)
[facility] clinic  Provider:  Kenard Gower DNP   Code Status:  Full Code   Goals of Care:     06/17/2023   10:51 AM  Advanced Directives  Does Patient Have a Medical Advance Directive? No  Would patient like information on creating a medical advance directive? No - Patient declined     Chief Complaint  Patient presents with   Follow-up    Patient is here for a check up    HPI: Patient is a 32 y.o. male seen today for an acute visit for  Past Medical History:  Diagnosis Date   Sciatica of right side     Past Surgical History:  Procedure Laterality Date   CLOSED REDUCTION NASAL FRACTURE N/A 05/23/2020   Procedure: CLOSED REDUCTION NASAL FRACTURE;  Surgeon: Serena Colonel, MD;  Location: [facility];  Service: ENT;  Laterality: N/A;   HAND SURGERY Right 2017   WISDOM TOOTH EXTRACTION      No Known Allergies  Outpatient Encounter Medications as of 06/17/2023  Medication Sig   lidocaine (XYLOCAINE) 2 % solution Use as directed 15 mLs in the mouth or throat as needed for mouth pain. (Patient not taking: Reported on 06/17/2023)   No facility-administered encounter medications on file as of 06/17/2023.    Review of Systems:  Review of Systems  Constitutional:  Negative for activity change, appetite change and fever.  HENT:  Negative for sore throat.   Eyes: Negative.   Cardiovascular:  Negative for chest pain and leg swelling.  Gastrointestinal:  Negative for abdominal distention, diarrhea and vomiting.  Genitourinary:  Negative for dysuria, frequency and urgency.  Skin:  Negative for color change.  Neurological:  Negative for dizziness and headaches.  Psychiatric/Behavioral:  Negative for behavioral problems and sleep disturbance. The patient is not nervous/anxious.     Health Maintenance  Topic Date Due   INFLUENZA VACCINE  Never done   COVID-19 Vaccine (1 - 2023-24 season) Never done   DTaP/Tdap/Td (2 - Td or Tdap) 01/25/2027   Hepatitis C  Screening  Completed   HIV Screening  Completed   HPV VACCINES  Aged Out    Physical Exam: Vitals:   06/17/23 1051  BP: 136/88  Pulse: 75  Resp: 17  Temp: 97.7 F (36.5 C)  TempSrc: Temporal  SpO2: 95%  Weight: 241 lb (109.3 kg)  Height: 5\' 8"  (1.727 m)   Body mass index is 36.64 kg/m. Physical Exam Constitutional:      Appearance: Normal appearance.  HENT:     Head: Normocephalic and atraumatic.     Mouth/Throat:     Mouth: Mucous membranes are moist.  Eyes:     Conjunctiva/sclera: Conjunctivae normal.  Cardiovascular:     Rate and Rhythm: Normal rate and regular rhythm.     Pulses: Normal pulses.     Heart sounds: Normal heart sounds.  Pulmonary:     Effort: Pulmonary effort is normal.     Breath sounds: Normal breath sounds.  Abdominal:     General: Bowel sounds are normal.     Palpations: Abdomen is soft.  Musculoskeletal:        General: No swelling. Normal range of motion.     Cervical back: Normal range of motion.  Skin:    General: Skin is warm and dry.  Neurological:     General: No focal deficit present.     Mental Status: He is alert and oriented to person, place, and time.  Psychiatric:  Mood and Affect: Mood normal.        Behavior: Behavior normal.        Thought Content: Thought content normal.        Judgment: Judgment normal.     Labs reviewed: Basic Metabolic Panel: Recent Labs    06/17/22 1531  NA 139  K 4.8  CL 105  CO2 25  GLUCOSE 91  BUN 13  CREATININE 0.96  CALCIUM 10.1   Liver Function Tests: Recent Labs    06/17/22 1531  AST 14  ALT 20  BILITOT 0.4  PROT 7.8   No results for input(s): "LIPASE", "AMYLASE" in the last 8760 hours. No results for input(s): "AMMONIA" in the last 8760 hours. CBC: Recent Labs    06/17/22 1531  WBC 5.9  NEUTROABS 3,233  HGB 14.5  HCT 44.0  MCV 82.4  PLT 274   Lipid Panel: Recent Labs    06/17/22 1541  CHOL 196  HDL 53  LDLCALC 121*  TRIG 108  CHOLHDL 3.7   Lab  Results  Component Value Date   HGBA1C 5.8 (H) 06/17/2022    Procedures since last visit: No results found.  Assessment/Plan There are no diagnoses linked to this encounter.   Labs/tests ordered:  * No order type specified * Next appt:  Visit date not found

## 2023-06-22 ENCOUNTER — Other Ambulatory Visit: Payer: BC Managed Care – PPO

## 2023-06-22 ENCOUNTER — Other Ambulatory Visit: Payer: Self-pay

## 2023-06-22 DIAGNOSIS — R7303 Prediabetes: Secondary | ICD-10-CM | POA: Diagnosis not present

## 2023-06-22 DIAGNOSIS — Z113 Encounter for screening for infections with a predominantly sexual mode of transmission: Secondary | ICD-10-CM | POA: Diagnosis not present

## 2023-06-22 DIAGNOSIS — E7800 Pure hypercholesterolemia, unspecified: Secondary | ICD-10-CM | POA: Diagnosis not present

## 2023-06-22 DIAGNOSIS — H538 Other visual disturbances: Secondary | ICD-10-CM

## 2023-06-23 LAB — CBC WITH DIFFERENTIAL/PLATELET
Absolute Monocytes: 592 {cells}/uL (ref 200–950)
Basophils Absolute: 33 {cells}/uL (ref 0–200)
Basophils Relative: 0.5 %
Eosinophils Absolute: 351 {cells}/uL (ref 15–500)
Eosinophils Relative: 5.4 %
HCT: 42 % (ref 38.5–50.0)
Hemoglobin: 13.7 g/dL (ref 13.2–17.1)
Lymphs Abs: 1996 {cells}/uL (ref 850–3900)
MCH: 27 pg (ref 27.0–33.0)
MCHC: 32.6 g/dL (ref 32.0–36.0)
MCV: 82.7 fL (ref 80.0–100.0)
MPV: 11.5 fL (ref 7.5–12.5)
Monocytes Relative: 9.1 %
Neutro Abs: 3530 {cells}/uL (ref 1500–7800)
Neutrophils Relative %: 54.3 %
Platelets: 243 10*3/uL (ref 140–400)
RBC: 5.08 10*6/uL (ref 4.20–5.80)
RDW: 14 % (ref 11.0–15.0)
Total Lymphocyte: 30.7 %
WBC: 6.5 10*3/uL (ref 3.8–10.8)

## 2023-06-23 LAB — COMPLETE METABOLIC PANEL WITH GFR
AG Ratio: 1.4 (calc) (ref 1.0–2.5)
ALT: 30 U/L (ref 9–46)
AST: 21 U/L (ref 10–40)
Albumin: 4.1 g/dL (ref 3.6–5.1)
Alkaline phosphatase (APISO): 61 U/L (ref 36–130)
BUN: 13 mg/dL (ref 7–25)
CO2: 23 mmol/L (ref 20–32)
Calcium: 9.1 mg/dL (ref 8.6–10.3)
Chloride: 106 mmol/L (ref 98–110)
Creat: 0.9 mg/dL (ref 0.60–1.26)
Globulin: 3 g/dL (ref 1.9–3.7)
Glucose, Bld: 104 mg/dL — ABNORMAL HIGH (ref 65–99)
Potassium: 4.2 mmol/L (ref 3.5–5.3)
Sodium: 137 mmol/L (ref 135–146)
Total Bilirubin: 0.3 mg/dL (ref 0.2–1.2)
Total Protein: 7.1 g/dL (ref 6.1–8.1)
eGFR: 116 mL/min/{1.73_m2} (ref >=60)

## 2023-06-23 LAB — HEMOGLOBIN A1C
Hgb A1c MFr Bld: 6.1 %{Hb} — ABNORMAL HIGH (ref <5.7)
Mean Plasma Glucose: 128 mg/dL
eAG (mmol/L): 7.1 mmol/L

## 2023-06-23 LAB — LIPID PANEL
Cholesterol: 208 mg/dL — ABNORMAL HIGH (ref <200)
HDL: 51 mg/dL (ref >=40)
LDL Cholesterol (Calc): 134 mg/dL — ABNORMAL HIGH
Non-HDL Cholesterol (Calc): 157 mg/dL — ABNORMAL HIGH (ref <130)
Total CHOL/HDL Ratio: 4.1 (calc) (ref <5.0)
Triglycerides: 120 mg/dL (ref <150)

## 2023-06-23 LAB — HEPATITIS C ANTIBODY: Hepatitis C Ab: NONREACTIVE

## 2023-06-23 LAB — HIV ANTIBODY (ROUTINE TESTING W REFLEX): HIV 1&2 Ab, 4th Generation: NONREACTIVE

## 2023-06-26 NOTE — Progress Notes (Signed)
-    cholesterol 208, up from 196 -  triglycerides normal -  LDL 138, elevated , up from 121 (a year ago) -  exercise for at least 150 minutes/week and low fat diet -  A1C 6.1, ranging as prediabetes, will need to lose weight and exercise -  hep C and HIV negative -  kid ney function, electrolytes, liver enzymes all normal -  Not anemia

## 2023-08-01 DIAGNOSIS — J45998 Other asthma: Secondary | ICD-10-CM | POA: Diagnosis not present

## 2023-08-01 DIAGNOSIS — J189 Pneumonia, unspecified organism: Secondary | ICD-10-CM | POA: Diagnosis not present

## 2023-08-01 DIAGNOSIS — R051 Acute cough: Secondary | ICD-10-CM | POA: Diagnosis not present

## 2023-08-01 DIAGNOSIS — M79645 Pain in left finger(s): Secondary | ICD-10-CM | POA: Diagnosis not present

## 2023-08-09 DIAGNOSIS — J301 Allergic rhinitis due to pollen: Secondary | ICD-10-CM | POA: Diagnosis not present

## 2023-08-09 DIAGNOSIS — J3089 Other allergic rhinitis: Secondary | ICD-10-CM | POA: Diagnosis not present

## 2023-08-09 DIAGNOSIS — J45998 Other asthma: Secondary | ICD-10-CM | POA: Diagnosis not present

## 2023-08-09 DIAGNOSIS — R052 Subacute cough: Secondary | ICD-10-CM | POA: Diagnosis not present

## 2023-08-09 DIAGNOSIS — J4530 Mild persistent asthma, uncomplicated: Secondary | ICD-10-CM | POA: Diagnosis not present

## 2023-10-27 ENCOUNTER — Encounter: Payer: BC Managed Care – PPO | Admitting: Adult Health

## 2023-10-27 NOTE — Progress Notes (Signed)
No show  This encounter was created in error - please disregard.

## 2023-11-06 DIAGNOSIS — J069 Acute upper respiratory infection, unspecified: Secondary | ICD-10-CM | POA: Diagnosis not present

## 2023-11-06 DIAGNOSIS — R52 Pain, unspecified: Secondary | ICD-10-CM | POA: Diagnosis not present

## 2023-11-06 DIAGNOSIS — R059 Cough, unspecified: Secondary | ICD-10-CM | POA: Diagnosis not present

## 2023-11-06 DIAGNOSIS — R0981 Nasal congestion: Secondary | ICD-10-CM | POA: Diagnosis not present

## 2023-11-08 DIAGNOSIS — J680 Bronchitis and pneumonitis due to chemicals, gases, fumes and vapors: Secondary | ICD-10-CM | POA: Diagnosis not present

## 2023-11-08 DIAGNOSIS — R519 Headache, unspecified: Secondary | ICD-10-CM | POA: Diagnosis not present

## 2023-11-08 DIAGNOSIS — R0602 Shortness of breath: Secondary | ICD-10-CM | POA: Diagnosis not present

## 2023-11-08 DIAGNOSIS — R6883 Chills (without fever): Secondary | ICD-10-CM | POA: Diagnosis not present

## 2024-03-06 ENCOUNTER — Ambulatory Visit: Payer: Self-pay

## 2024-03-06 NOTE — Telephone Encounter (Signed)
 FYI Only or Action Required?: FYI only for provider.  Patient was last seen in primary care on 06/17/2023 by [name], Jereld [name], NP. Called Nurse Triage reporting Headache. Symptoms began several months ago. Interventions attempted: OTC medications: ibuprofen . Symptoms are: unchanged.  Triage Disposition: See PCP Within 2 Weeks  Patient/caregiver understands and will follow disposition?: Yes   Copied from CRM [id number]. Topic: Clinical - Red Word Triage >> Mar 06, 2024  2:59 PM Mercer PEDLAR wrote: Red Word that prompted transfer to Nurse Triage: severe headaches.   ----------------------------------------------------------------------- From previous Reason for Contact - Scheduling: Patient/patient representative is calling to schedule an appointment. Refer to attachments for appointment information. Reason for Disposition  Headache is a chronic symptom (recurrent or ongoing AND present > 4 weeks)  Answer Assessment - Initial Assessment Questions 1. LOCATION: Where does it hurt?      Pain at bilateral temples 2. ONSET: When did the headache start? (Minutes, hours or days)      A few months 3. PATTERN: Does the pain come and go, or has it been constant since it started?     Comes and goes.   4. SEVERITY: How bad is the pain? and What does it keep you from doing?  (e.g., Scale 1-10; mild, moderate, or severe)   - MILD (1-3): doesn't interfere with normal activities    - MODERATE (4-7): interferes with normal activities or awakens from sleep    - SEVERE (8-10): excruciating pain, unable to do any normal activities        Does not have headache at this time, but pain is 9/10 at times 5. RECURRENT SYMPTOM: Have you ever had headaches before? If Yes, ask: When was the last time? and What happened that time?      Not like these headaches 6. CAUSE: What do you think is causing the headache?     States that he works at a [employer] and that when he leaves work, the sun  is so bright that he needs to wait for the sun to go down before he can drive home 7. MIGRAINE: Have you been diagnosed with migraine headaches? If Yes, ask: Is this headache similar?      Denies, but thinks he may be having migraines 8. HEAD INJURY: Has there been any recent injury to the head?      denies 9. OTHER SYMPTOMS: Do you have any other symptoms? (fever, stiff neck, eye pain, sore throat, cold symptoms)     Denies  Patient was offered sooner appointment, but he is unable to attend before August due to travel  Protocols used: [facility]

## 2024-03-06 NOTE — Telephone Encounter (Signed)
 Patient was offered sooner appointment, but he is unable to attend before August due to travel    FYI

## 2024-03-07 NOTE — Telephone Encounter (Signed)
 Patient called and notified. I offered patient appointment tomorrow 03/08/2024 at 3pm but he declined because he has to work. Patient stated that he will get back to us  when he can be seen in office or go to urgent care as recommended.

## 2024-03-07 NOTE — Telephone Encounter (Signed)
 He can start taking Acetaminophen  1,000 mg PO Q 8 hours PRN for pain. He needs to be seen in the clinic for further evaluation or can go to urgent care.

## 2024-05-10 ENCOUNTER — Ambulatory Visit: Admitting: Adult Health

## 2024-07-19 ENCOUNTER — Ambulatory Visit: Admitting: Adult Health

## 2024-07-19 ENCOUNTER — Ambulatory Visit
Admission: RE | Admit: 2024-07-19 | Discharge: 2024-07-19 | Disposition: A | Discharge status: Home / Self Care | Source: Ambulatory Visit | Attending: Adult Health | Admitting: Adult Health

## 2024-07-19 ENCOUNTER — Encounter: Payer: Self-pay | Admitting: Adult Health

## 2024-07-19 VITALS — BP 132/82 | HR 92 | Temp 98.2°F | Ht 68.0 in | Wt 271.8 lb

## 2024-07-19 DIAGNOSIS — R7303 Prediabetes: Secondary | ICD-10-CM | POA: Diagnosis not present

## 2024-07-19 DIAGNOSIS — K219 Gastro-esophageal reflux disease without esophagitis: Secondary | ICD-10-CM

## 2024-07-19 DIAGNOSIS — M542 Cervicalgia: Secondary | ICD-10-CM

## 2024-07-19 DIAGNOSIS — G629 Polyneuropathy, unspecified: Secondary | ICD-10-CM

## 2024-07-19 DIAGNOSIS — E782 Mixed hyperlipidemia: Secondary | ICD-10-CM | POA: Diagnosis not present

## 2024-07-19 DIAGNOSIS — Z0289 Encounter for other administrative examinations: Secondary | ICD-10-CM

## 2024-07-19 MED ORDER — IBUPROFEN 200 MG PO TABS
800.0000 mg | ORAL_TABLET | Freq: Two times a day (BID) | ORAL | Status: AC | PRN
Start: 2024-07-19 — End: ?

## 2024-07-19 MED ORDER — PANTOPRAZOLE SODIUM 40 MG PO TBEC: 40.0000 mg | DELAYED_RELEASE_TABLET | Freq: Every day | ORAL | 1 refills | Status: AC
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
## 2024-07-23 ENCOUNTER — Other Ambulatory Visit

## 2024-07-23 DIAGNOSIS — R7303 Prediabetes: Secondary | ICD-10-CM

## 2024-07-23 DIAGNOSIS — E782 Mixed hyperlipidemia: Secondary | ICD-10-CM | POA: Diagnosis not present

## 2024-07-24 LAB — COMPREHENSIVE METABOLIC PANEL WITH GFR
AG Ratio: 1.4 (calc) (ref 1.0–2.5)
ALT: 21 U/L (ref 9–46)
AST: 17 U/L (ref 10–40)
Albumin: 4.3 g/dL (ref 3.6–5.1)
Alkaline phosphatase (APISO): 54 U/L (ref 36–130)
BUN: 9 mg/dL (ref 7–25)
CO2: 29 mmol/L (ref 20–32)
Calcium: 9.6 mg/dL (ref 8.6–10.3)
Chloride: 106 mmol/L (ref 98–110)
Creat: 0.83 mg/dL (ref 0.60–1.26)
Globulin: 3.1 g/dL (ref 1.9–3.7)
Glucose, Bld: 99 mg/dL (ref 65–99)
Potassium: 5 mmol/L (ref 3.5–5.3)
Sodium: 141 mmol/L (ref 135–146)
Total Bilirubin: 0.4 mg/dL (ref 0.2–1.2)
Total Protein: 7.4 g/dL (ref 6.1–8.1)
eGFR: 118 mL/min/1.73m2 (ref >=60)

## 2024-07-24 LAB — HEMOGLOBIN A1C
Hgb A1c MFr Bld: 6.8 % — ABNORMAL HIGH (ref <5.7)
Mean Plasma Glucose: 148 mg/dL
eAG (mmol/L): 8.2 mmol/L

## 2024-07-24 LAB — LIPID PANEL
Cholesterol: 180 mg/dL (ref <200)
HDL: 46 mg/dL (ref >=40)
LDL Cholesterol (Calc): 113 mg/dL — ABNORMAL HIGH
Non-HDL Cholesterol (Calc): 134 mg/dL — ABNORMAL HIGH (ref <130)
Total CHOL/HDL Ratio: 3.9 (calc) (ref <5.0)
Triglycerides: 107 mg/dL (ref <150)

## 2024-07-24 LAB — CBC WITH DIFFERENTIAL/PLATELET
Absolute Lymphocytes: 2054 {cells}/uL (ref 850–3900)
Absolute Monocytes: 494 {cells}/uL (ref 200–950)
Basophils Absolute: 31 {cells}/uL (ref 0–200)
Basophils Relative: 0.6 %
Eosinophils Absolute: 291 {cells}/uL (ref 15–500)
Eosinophils Relative: 5.6 %
HCT: 40.4 % (ref 38.5–50.0)
Hemoglobin: 12.7 g/dL — ABNORMAL LOW (ref 13.2–17.1)
MCH: 25.5 pg — ABNORMAL LOW (ref 27.0–33.0)
MCHC: 31.4 g/dL — ABNORMAL LOW (ref 32.0–36.0)
MCV: 81 fL (ref 80.0–100.0)
MPV: 11.3 fL (ref 7.5–12.5)
Monocytes Relative: 9.5 %
Neutro Abs: 2330 {cells}/uL (ref 1500–7800)
Neutrophils Relative %: 44.8 %
Platelets: 298 Thousand/uL (ref 140–400)
RBC: 4.99 Million/uL (ref 4.20–5.80)
RDW: 14.6 % (ref 11.0–15.0)
Total Lymphocyte: 39.5 %
WBC: 5.2 Thousand/uL (ref 3.8–10.8)

## 2024-07-25 ENCOUNTER — Ambulatory Visit: Payer: Self-pay | Admitting: Adult Health

## 2024-07-25 ENCOUNTER — Other Ambulatory Visit: Payer: Self-pay | Admitting: Adult Health

## 2024-07-25 DIAGNOSIS — E1142 Type 2 diabetes mellitus with diabetic polyneuropathy: Secondary | ICD-10-CM

## 2024-07-25 MED ORDER — BLOOD GLUCOSE TEST VI STRP: 1.0000 | ORAL_STRIP | Freq: Every day | 3 refills | Status: AC

## 2024-07-25 MED ORDER — METFORMIN HCL 500 MG PO TABS
500.0000 mg | ORAL_TABLET | Freq: Two times a day (BID) | ORAL | 3 refills | Status: AC
Start: 2024-07-25 — End: ?

## 2024-07-25 MED ORDER — LANCETS MISC: 1.0000 | 0 refills | Status: AC

## 2024-07-25 MED ORDER — BLOOD GLUCOSE MONITORING SUPPL DEVI: 1.0000 | Freq: Every day | 0 refills | Status: AC

## 2024-07-25 MED ORDER — LANCET DEVICE MISC: 1.0000 | Freq: Every day | 0 refills | Status: AC

## 2024-07-25 NOTE — Progress Notes (Signed)
-    cervical spine imagine negative for abnormality -   Liver enzymes, electrolytes, kidney function, all normal - Hemoglobin low but otherwise stable -   A1c 6.8, ranging as type II diabetic and will need to start on medication and probably explains the neuropathies on hands, sent E Rx to pharmacy for metformin 500 mg twice a day, check blood sugar daily - LDL 1 3, elevated but down from 134 a year ago, recommending to exercise for at least 150 minutes/week, eat low-carb diet and increase vegetable intake

## 2024-07-26 ENCOUNTER — Encounter (INDEPENDENT_AMBULATORY_CARE_PROVIDER_SITE_OTHER): Payer: Self-pay

## 2024-07-31 ENCOUNTER — Telehealth: Payer: Self-pay | Admitting: *Deleted

## 2024-07-31 NOTE — Telephone Encounter (Signed)
 I haven't received any new forms yet for your FMLA.

## 2024-07-31 NOTE — Telephone Encounter (Unsigned)
 Copied from CRM [id number]. Topic: General - Other >> Jul 31, 2024  1:54 PM [place] ORN wrote: Reason for CRM: Patient called. States he was seen a few weeks ago and completed FMLA paperwork. Received an email from HR that they needed a recertification. They are faxing to provider to complete and send back. Patient would like to speak with provider to see if forms were received and discuss information before its sent back. Thank You >> Jul 31, 2024  1:59 PM Peter Park ORN wrote: Has to be sent back by 12/3.

## 2024-08-01 ENCOUNTER — Telehealth: Payer: Self-pay | Admitting: Adult Health

## 2024-08-01 DIAGNOSIS — Z0279 Encounter for issue of other medical certificate: Secondary | ICD-10-CM

## 2024-08-01 NOTE — Telephone Encounter (Signed)
 Copied from CRM [id number]. Topic: General - Other >> Jul 31, 2024  1:54 PM Gra[place] ORN wrote: Reason for CRM: Patient called. States he was seen a few weeks ago and completed FMLA paperwork. Received an email from HR that they needed a recertification. They are faxing to provider to complete and send back. Patient would like to speak with provider to see if forms were received and discuss information before its sent back. Thank You

## 2024-08-01 NOTE — Telephone Encounter (Signed)
 Patient came in and dropped off FMLA paperwork to be completed by PCP Monina. He was also scheduled for an appointment just in case its needed. Request a call back. Forms given to [facility] in [place].

## 2024-08-01 NOTE — Telephone Encounter (Signed)
 Noted. Will complete form when we recive it.

## 2024-08-01 NOTE — Telephone Encounter (Signed)
 Noted

## 2024-08-01 NOTE — Telephone Encounter (Signed)
 Spoke with patient and stating that he is going to his job to pick the [employer] paperwork for [name], Peter [name], NP to signed and be completed.   Message sent to [name], [name], NP

## 2024-08-02 NOTE — Telephone Encounter (Signed)
 Patient informed that forms were completed and faxed to numbers provided Fax# [phone] or [phone]

## 2024-08-06 ENCOUNTER — Ambulatory Visit: Admitting: Adult Health

## 2024-10-15 ENCOUNTER — Ambulatory Visit: Admitting: Adult Health
